# Patient Record
Sex: Male | Born: 1975 | Race: Black or African American | Hispanic: No | Marital: Single | Smoking: Never smoker
Health system: Southern US, Community
[De-identification: ages and names within clinical notes are randomized; demographics above are authoritative.]

## PROBLEM LIST (undated history)

## (undated) DIAGNOSIS — U071 COVID-19: Secondary | ICD-10-CM

## (undated) DIAGNOSIS — J189 Pneumonia, unspecified organism: Secondary | ICD-10-CM

---

## 2002-06-28 ENCOUNTER — Encounter: Payer: Self-pay | Admitting: Emergency Medicine

## 2002-06-28 ENCOUNTER — Emergency Department (HOSPITAL_COMMUNITY): Admission: EM | Admit: 2002-06-28 | Discharge: 2002-06-29 | Payer: Self-pay | Admitting: Emergency Medicine

## 2002-07-20 ENCOUNTER — Emergency Department (HOSPITAL_COMMUNITY): Admission: EM | Admit: 2002-07-20 | Discharge: 2002-07-20 | Payer: Self-pay | Admitting: *Deleted

## 2002-07-23 ENCOUNTER — Emergency Department (HOSPITAL_COMMUNITY): Admission: EM | Admit: 2002-07-23 | Discharge: 2002-07-23 | Payer: Self-pay | Admitting: Emergency Medicine

## 2008-07-20 ENCOUNTER — Emergency Department (HOSPITAL_COMMUNITY): Admission: EM | Admit: 2008-07-20 | Discharge: 2008-07-20 | Payer: Self-pay | Admitting: Family Medicine

## 2015-03-12 ENCOUNTER — Encounter (HOSPITAL_COMMUNITY): Payer: Self-pay | Admitting: Nurse Practitioner

## 2015-03-12 ENCOUNTER — Emergency Department (HOSPITAL_COMMUNITY)
Admission: EM | Admit: 2015-03-12 | Discharge: 2015-03-12 | Disposition: A | Discharge status: Home / Self Care | Payer: Self-pay | Attending: Emergency Medicine | Admitting: Emergency Medicine

## 2015-03-12 ENCOUNTER — Emergency Department (HOSPITAL_COMMUNITY): Payer: Self-pay

## 2015-03-12 DIAGNOSIS — Z8701 Personal history of pneumonia (recurrent): Secondary | ICD-10-CM | POA: Insufficient documentation

## 2015-03-12 DIAGNOSIS — J069 Acute upper respiratory infection, unspecified: Secondary | ICD-10-CM | POA: Insufficient documentation

## 2015-03-12 HISTORY — DX: Pneumonia, unspecified organism: J18.9

## 2015-03-12 MED ORDER — GUAIFENESIN-CODEINE 100-10 MG/5ML PO SOLN: 5.0000 mL | Freq: Three times a day (TID) | ORAL | Status: DC | PRN

## 2015-03-12 MED ORDER — FLUTICASONE PROPIONATE 50 MCG/ACT NA SUSP: 2.0000 | Freq: Every day | NASAL | Status: DC

## 2015-03-12 NOTE — ED Notes (Signed)
Declined W/C at D/C and was escorted to lobby by RN. 

## 2015-03-12 NOTE — ED Notes (Signed)
He c/o dry cough and fevers for past 3-4 days. He denies pain, sob, n/v, bowel/bladder changes. He was treated for pneumonia with azithromycin orally in the beginning of February with full symptom resolution. He is A&Ox4, resp e/u

## 2015-03-12 NOTE — ED Provider Notes (Signed)
CSN: [id number]     Arrival date & time 03/12/15  1436 History  By signing my name below, I, Evon Slack, attest that this documentation has been prepared under the direction and in the presence of [employer], PA-C. Electronically Signed: Evon Slack, ED Scribe. 03/12/2015. 5:06 PM.     Chief Complaint  Patient presents with  . Cough   The history is provided by the patient. No language interpreter was used.   HPI Comments: Harold Farley is a 40 y.o. male who presents to the Emergency Department complaining of dry cough onset 4 days prior. He reports associated subjective fever, rhinorrhea and congestion. Pt doesn't report any medications PTA. Denies CP, SOB, orthopnea or leg swelling. Pt denies tobacco use. Reports Hx of Pneumonia on February 6 and was placed on azithromycin with relief of symptoms.   Past Medical History  Diagnosis Date  . Pneumonia    History reviewed. No pertinent past surgical history. History reviewed. No pertinent family history. Social History  Substance Use Topics  . Smoking status: Never Smoker   . Smokeless tobacco: None  . Alcohol Use: Yes    Review of Systems  All other systems reviewed and are negative.    Allergies  Review of patient's allergies indicates no known allergies.  Home Medications   Prior to Admission medications   Medication Sig Start Date End Date Taking? Authorizing Provider  fluticasone (FLONASE) 50 MCG/ACT nasal spray Place 2 sprays into both nostrils daily. 03/12/15   Tinnie Gens Lolah Coghlan, PA-C  guaiFENesin-codeine 100-10 MG/5ML syrup Take 5 mLs by mouth 3 (three) times daily as needed for cough. 03/12/15   Bellamia Ferch, PA-C   BP 118/73 mmHg  Pulse 95  Temp(Src) 99.4 F (37.4 C) (Oral)  Resp 18  SpO2 100%   Physical Exam  Constitutional: He is oriented to person, place, and time. He appears well-developed and well-nourished. No distress.  HENT:  Head: Normocephalic and atraumatic.  Right Ear: Tympanic  membrane and external ear normal.  Left Ear: Tympanic membrane and external ear normal.  Nose: Rhinorrhea present.  Mouth/Throat: Uvula is midline and oropharynx is clear and moist. No oropharyngeal exudate, posterior oropharyngeal edema, posterior oropharyngeal erythema or tonsillar abscesses.  Eyes: Conjunctivae and EOM are normal.  Neck: Neck supple. No tracheal deviation present.  Cardiovascular: Normal rate, regular rhythm and normal heart sounds.   Pulmonary/Chest: Effort normal and breath sounds normal. No respiratory distress. He has no wheezes. He has no rales. He exhibits no tenderness.  Abdominal: Soft. There is no tenderness.  Musculoskeletal: Normal range of motion. He exhibits no edema.  Neurological: He is alert and oriented to person, place, and time.  Skin: Skin is warm and dry.  Psychiatric: He has a normal mood and affect. His behavior is normal.  Nursing note and vitals reviewed.   ED Course  Procedures (including critical care time) DIAGNOSTIC STUDIES: Oxygen Saturation is 96% on RA, adequate by my interpretation.    COORDINATION OF CARE: 5:06 PM-Discussed treatment plan with pt at bedside and pt agreed to plan.     Labs Review Labs Reviewed - No data to display  Imaging Review Dg Chest 2 View  03/12/2015  CLINICAL DATA:  Pt reports dry cough x 3-4 days; pt reports PNA dx 02/27/15; non-smoker EXAM: CHEST  2 VIEW COMPARISON:  None. FINDINGS: Heart size is normal. There is no focal consolidation. Small bilateral pleural effusions are present. There is no pulmonary edema. Visualized osseous structures have a normal appearance.  IMPRESSION: Small bilateral pleural effusions. Electronically Signed   By: Norva Pavlov M.D.   On: 03/12/2015 16:40      EKG Interpretation None      MDM   Final diagnoses:  URI (upper respiratory infection)   Labs:  Imaging: DG chest  Consults:  Therapeutics:  Discharge Meds:   Assessment/Plan: 40 year old male  presents today with URI symptoms. Patient was recently treated for suspected pneumonia. He had no chest x-ray at that time was reporting shortness of breath, cough, fever. He was placed on azithromycin which improved his symptoms. Today's presentation with upper respiratory infectious symptoms, oxygen on a percent, afebrile, nontoxic with reassuring vital signs. Patient is in no acute distress, chest x-ray showed small bilateral pleural effusions. Due to patient's recent suspected pneumonia and recent treatment, patient will be instructed to follow-up in 2 days for reevaluation to monitor progression of upper respiratory infection and reevaluation of pleural effusions. Patient is given strict return precautions he verbalized understanding and agreement for today's plan had no further questions or concerns at time of discharge    I personally performed the services described in this documentation, which was scribed in my presence. The recorded information has been reviewed and is accurate.      Eyvonne Mechanic, PA-C 03/13/15 1610  Azalia Bilis, MD 03/13/15 [phone]

## 2015-03-12 NOTE — Discharge Instructions (Signed)
Please follow-up in 2 days with your primary care for repeat chest x-ray and reevaluation. If any new or worsening signs or symptoms present please return immediately to the emergency room. Upper Respiratory Infection, Adult Most upper respiratory infections (URIs) are a viral infection of the air passages leading to the lungs. A URI affects the nose, throat, and upper air passages. The most common type of URI is nasopharyngitis and is typically referred to as "the common cold." URIs run their course and usually go away on their own. Most of the time, a URI does not require medical attention, but sometimes a bacterial infection in the upper airways can follow a viral infection. This is called a secondary infection. Sinus and middle ear infections are common types of secondary upper respiratory infections. Bacterial pneumonia can also complicate a URI. A URI can worsen asthma and chronic obstructive pulmonary disease (COPD). Sometimes, these complications can require emergency medical care and may be life threatening.  CAUSES Almost all URIs are caused by viruses. A virus is a type of germ and can spread from one person to another.  RISKS FACTORS You may be at risk for a URI if:   You smoke.   You have chronic heart or lung disease.  You have a weakened defense (immune) system.   You are very young or very old.   You have nasal allergies or asthma.  You work in crowded or poorly ventilated areas.  You work in health care facilities or schools. SIGNS AND SYMPTOMS  Symptoms typically develop 2-3 days after you come in contact with a cold virus. Most viral URIs last 7-10 days. However, viral URIs from the influenza virus (flu virus) can last 14-18 days and are typically more severe. Symptoms may include:   Runny or stuffy (congested) nose.   Sneezing.   Cough.   Sore throat.   Headache.   Fatigue.   Fever.   Loss of appetite.   Pain in your forehead, behind your eyes,  and over your cheekbones (sinus pain).  Muscle aches.  DIAGNOSIS  Your health care provider may diagnose a URI by:  Physical exam.  Tests to check that your symptoms are not due to another condition such as:  Strep throat.  Sinusitis.  Pneumonia.  Asthma. TREATMENT  A URI goes away on its own with time. It cannot be cured with medicines, but medicines may be prescribed or recommended to relieve symptoms. Medicines may help:  Reduce your fever.  Reduce your cough.  Relieve nasal congestion. HOME CARE INSTRUCTIONS   Take medicines only as directed by your health care provider.   Gargle warm saltwater or take cough drops to comfort your throat as directed by your health care provider.  Use a warm mist humidifier or inhale steam from a shower to increase air moisture. This may make it easier to breathe.  Drink enough fluid to keep your urine clear or pale yellow.   Eat soups and other clear broths and maintain good nutrition.   Rest as needed.   Return to work when your temperature has returned to normal or as your health care provider advises. You may need to stay home longer to avoid infecting others. You can also use a face mask and careful hand washing to prevent spread of the virus.  Increase the usage of your inhaler if you have asthma.   Do not use any tobacco products, including cigarettes, chewing tobacco, or electronic cigarettes. If you need help quitting, ask your  health care provider. PREVENTION  The best way to protect yourself from getting a cold is to practice good hygiene.   Avoid oral or hand contact with people with cold symptoms.   Wash your hands often if contact occurs.  There is no clear evidence that vitamin C, vitamin E, echinacea, or exercise reduces the chance of developing a cold. However, it is always recommended to get plenty of rest, exercise, and practice good nutrition.  SEEK MEDICAL CARE IF:   You are getting worse rather than  better.   Your symptoms are not controlled by medicine.   You have chills.  You have worsening shortness of breath.  You have brown or red mucus.  You have yellow or brown nasal discharge.  You have pain in your face, especially when you bend forward.  You have a fever.  You have swollen neck glands.  You have pain while swallowing.  You have white areas in the back of your throat. SEEK IMMEDIATE MEDICAL CARE IF:   You have severe or persistent:  Headache.  Ear pain.  Sinus pain.  Chest pain.  You have chronic lung disease and any of the following:  Wheezing.  Prolonged cough.  Coughing up blood.  A change in your usual mucus.  You have a stiff neck.  You have changes in your:  Vision.  Hearing.  Thinking.  Mood. MAKE SURE YOU:   Understand these instructions.  Will watch your condition.  Will get help right away if you are not doing well or get worse.   This information is not intended to replace advice given to you by your health care provider. Make sure you discuss any questions you have with your health care provider.   Document Released: 07/03/2000 Document Revised: 05/24/2014 Document Reviewed: 04/14/2013 Elsevier Interactive Patient Education [employer]

## 2017-12-08 ENCOUNTER — Emergency Department (HOSPITAL_COMMUNITY): Payer: No Typology Code available for payment source

## 2017-12-08 ENCOUNTER — Encounter (HOSPITAL_COMMUNITY): Payer: Self-pay | Admitting: Emergency Medicine

## 2017-12-08 ENCOUNTER — Other Ambulatory Visit: Payer: Self-pay

## 2017-12-08 ENCOUNTER — Emergency Department (HOSPITAL_COMMUNITY)
Admission: EM | Admit: 2017-12-08 | Discharge: 2017-12-08 | Disposition: A | Discharge status: Home / Self Care | Payer: No Typology Code available for payment source | Attending: Emergency Medicine | Admitting: Emergency Medicine

## 2017-12-08 DIAGNOSIS — S61512A Laceration without foreign body of left wrist, initial encounter: Secondary | ICD-10-CM | POA: Insufficient documentation

## 2017-12-08 DIAGNOSIS — W268XXA Contact with other sharp object(s), not elsewhere classified, initial encounter: Secondary | ICD-10-CM | POA: Diagnosis not present

## 2017-12-08 DIAGNOSIS — R202 Paresthesia of skin: Secondary | ICD-10-CM | POA: Diagnosis not present

## 2017-12-08 DIAGNOSIS — Y998 Other external cause status: Secondary | ICD-10-CM | POA: Insufficient documentation

## 2017-12-08 DIAGNOSIS — Y9289 Other specified places as the place of occurrence of the external cause: Secondary | ICD-10-CM | POA: Diagnosis not present

## 2017-12-08 DIAGNOSIS — Z23 Encounter for immunization: Secondary | ICD-10-CM | POA: Diagnosis not present

## 2017-12-08 DIAGNOSIS — Y9389 Activity, other specified: Secondary | ICD-10-CM | POA: Insufficient documentation

## 2017-12-08 DIAGNOSIS — S6992XA Unspecified injury of left wrist, hand and finger(s), initial encounter: Secondary | ICD-10-CM | POA: Diagnosis present

## 2017-12-08 MED ORDER — CEPHALEXIN 500 MG PO CAPS
500.0000 mg | ORAL_CAPSULE | Freq: Once | ORAL | Status: AC
  Administered 2017-12-08: 500 mg via ORAL
  Filled 2017-12-08: qty 1

## 2017-12-08 MED ORDER — LIDOCAINE-EPINEPHRINE (PF) 2 %-1:200000 IJ SOLN
10.0000 mL | Freq: Once | INTRAMUSCULAR | Status: AC
  Administered 2017-12-08: 10 mL
  Filled 2017-12-08: qty 20

## 2017-12-08 MED ORDER — TETANUS-DIPHTH-ACELL PERTUSSIS 5-2.5-18.5 LF-MCG/0.5 IM SUSP
0.5000 mL | Freq: Once | INTRAMUSCULAR | Status: AC
  Administered 2017-12-08: 0.5 mL via INTRAMUSCULAR
  Filled 2017-12-08: qty 0.5

## 2017-12-08 MED ORDER — CEPHALEXIN 500 MG PO CAPS: 500.0000 mg | ORAL_CAPSULE | Freq: Four times a day (QID) | ORAL | 0 refills | Status: DC

## 2017-12-08 NOTE — ED Triage Notes (Signed)
Pt was at work and cut his left wrist with razor. Reports hand in tingling.

## 2017-12-08 NOTE — ED Provider Notes (Signed)
[facility]-EMERGENCY DEPT Provider Note   CSN: [id number] Arrival date & time: 12/08/17  [id number]     History   Chief Complaint Chief Complaint  Patient presents with  . Laceration    HPI Harold Farley is a 42 y.o. male.  Patient presents with acute onset of laceration to the volar aspect of his left wrist occurring approximately 30 minutes ago.  Patient was using a razor blade when he cut his left wrist.  This was at work.  Patient has tingling but not full numbness in all of his fingers.  He denies any weakness in his wrist, fingers, or hand.  Patient applied pressure and a bandage prior to arrival.  No other treatments.  Last tetanus unknown.  Reports mild pain.  Course is constant.     Past Medical History:  Diagnosis Date  . Pneumonia     There are no active problems to display for this patient.   History reviewed. No pertinent surgical history.      Home Medications    Prior to Admission medications   Medication Sig Start Date End Date Taking? Authorizing Provider  fluticasone (FLONASE) 50 MCG/ACT nasal spray Place 2 sprays into both nostrils daily. 03/12/15   Hedges, Tinnie GensJeffrey, PA-C  guaiFENesin-codeine 100-10 MG/5ML syrup Take 5 mLs by mouth 3 (three) times daily as needed for cough. 03/12/15   Eyvonne MechanicHedges, Jeffrey, PA-C    Family History No family history on file.  Social History Social History   Tobacco Use  . Smoking status: Never Smoker  . Smokeless tobacco: Never Used  Substance Use Topics  . Alcohol use: Yes  . Drug use: No     Allergies   Patient has no known allergies.   Review of Systems Review of Systems  Constitutional: Negative for activity change.  Musculoskeletal: Negative for arthralgias, back pain, gait problem, joint swelling and neck pain.  Skin: Positive for wound.  Neurological: Positive for numbness (no full numbness, + paresthesias). Negative for weakness.     Physical Exam Updated Vital Signs BP (!)  148/80 (BP Location: Right Arm)   Pulse 76   Temp 97.6 F (36.4 C) (Oral)   Resp 17   Ht 6\' 3"  (1.905 m)   Wt 103 kg   SpO2 99%   BMI 28.37 kg/m   Physical Exam  Constitutional: He appears well-developed and well-nourished.  HENT:  Head: Normocephalic and atraumatic.  Eyes: Conjunctivae are normal.  Neck: Normal range of motion. Neck supple.  Cardiovascular: Normal pulses. Exam reveals no decreased pulses.  Musculoskeletal: He exhibits tenderness. He exhibits no edema.  1 cm laceration, linear, hemostatic, overlying the volar surface of the left wrist at the radial aspect. Wound explored. Wound is puncture in nature.  I can visualize a structure, appears to be a tendon, along the ulnar aspect of the defect.  No defect noted of this structure.  Patient with full strength in flexion and extension of the wrist.  Full strength and range of motion of the hand and fingers.  Neurological: He is alert. No sensory deficit.  Patient reports decreased sensation in all fingers, however no complete numbness and no deficits in any distinct nerve distribution.  Skin: Skin is warm and dry.  Psychiatric: He has a normal mood and affect.  Nursing note and vitals reviewed.    ED Treatments / Results  Labs (all labs ordered are listed, but only abnormal results are displayed) Labs Reviewed - No data to display  EKG  None  Radiology Dg Wrist Complete Left  Result Date: 12/08/2017 CLINICAL DATA:  Left wrist laceration. EXAM: LEFT WRIST - COMPLETE 3+ VIEW COMPARISON:  None. FINDINGS: There is no evidence of fracture or dislocation. There is no evidence of arthropathy or other focal bone abnormality. Soft tissues are unremarkable. IMPRESSION: Negative. Electronically Signed   By: Lupita Raider, M.D.   On: 12/08/2017 08:24    Procedures .Marland KitchenLaceration Repair Date/Time: 12/08/2017 9:32 AM Performed by: Renne Crigler, PA-C Authorized by: Renne Crigler, PA-C   Consent:    Consent obtained:   Verbal   Consent given by:  Patient   Risks discussed:  Infection, pain, tendon damage, retained foreign body, vascular damage and nerve damage   Alternatives discussed:  No treatment Anesthesia (see MAR for exact dosages):    Anesthesia method:  Local infiltration   Local anesthetic:  Lidocaine 2% WITH epi Laceration details:    Location:  Hand   Hand location:  L wrist   Length (cm):  1 Repair type:    Repair type:  Simple Pre-procedure details:    Preparation:  Patient was prepped and draped in usual sterile fashion and imaging obtained to evaluate for foreign bodies Exploration:    Hemostasis achieved with:  Epinephrine   Wound exploration: wound explored through full range of motion and entire depth of wound probed and visualized     Wound extent: no nerve damage noted and no tendon damage noted   Treatment:    Area cleansed with:  Saline   Amount of cleaning:  Extensive   Irrigation solution:  Sterile saline   Irrigation volume:  250   Irrigation method:  Pressure wash   Visualized foreign bodies/material removed: no   Skin repair:    Repair method:  Sutures   Suture size:  5-0   Suture material:  Nylon   Suture technique:  Simple interrupted   Number of sutures:  3 Approximation:    Approximation:  Close Post-procedure details:    Dressing:  Open (no dressing)   Patient tolerance of procedure:  Tolerated well, no immediate complications   (including critical care time)  Medications Ordered in ED Medications  lidocaine-EPINEPHrine (XYLOCAINE W/EPI) 2 %-1:200000 (PF) injection 10 mL (has no administration in time range)  cephALEXin (KEFLEX) capsule 500 mg (has no administration in time range)  Tdap (BOOSTRIX) injection 0.5 mL (0.5 mLs Intramuscular Given 12/08/17 0800)     Initial Impression / Assessment and Plan / ED Course  I have reviewed the triage vital signs and the nursing notes.  Pertinent labs & imaging results that were available during my care of the  patient were reviewed by me and considered in my medical decision making (see chart for details).     Patient seen and examined. Work-up initiated. Medications ordered.   Vital signs reviewed and are as follows: BP (!) 148/80 (BP Location: Right Arm)   Pulse 76   Temp 97.6 F (36.4 C) (Oral)   Resp 17   Ht 6\' 3"  (1.905 m)   Wt 103 kg   SpO2 99%   BMI 28.37 kg/m   UDS performed.  X-ray reviewed.  Wound repaired as above.  Patient discharged with Velcro wrist splint.  Given puncture nature of the wound, patient was started on prophylactic Keflex for 5 days.  He is instructed to have the sutures removed in 10 days.  Information for orthopedic hand surgery given in case he has any issues with healing or function as the  wound heals.  9:25 AM Patient counseled on wound care. Patient counseled on need to return or see PCP/urgent care for suture removal in 10 days. Patient was urged to return to the Emergency Department urgently with worsening pain, swelling, expanding erythema especially if it streaks away from the affected area, fever, or if they have any other concerns. Patient verbalized understanding.    Final Clinical Impressions(s) / ED Diagnoses   Final diagnoses:  Laceration of left wrist, initial encounter   Wrist laceration.  Irrigated and repaired as above.  Do not suspect any major nerve or tendon injury.  Patient does not have any deficits.  He has paresthesias in the hand but no numbness in a distinct nerve distribution.  No indication for emergent orthopedic involvement.  ED Discharge Orders    None       Renne Crigler, PA-C 12/08/17 2952    Pricilla Loveless, MD 12/08/17 [phone]

## 2017-12-08 NOTE — Discharge Instructions (Signed)
Please read and follow all provided instructions.  Your diagnoses today include:  1. Laceration of left wrist, initial encounter     Tests performed today include:  X-ray of the affected area that did not show any foreign bodies or broken bones  Vital signs. See below for your results today.   Medications prescribed:   Keflex (cephalexin) - antibiotic  You have been prescribed an antibiotic medicine: take the entire course of medicine even if you are feeling better. Stopping early can cause the antibiotic not to work.  Take any prescribed medications only as directed.   Home care instructions:  Follow any educational materials and wound care instructions contained in this packet.   Use the wrist splint for the next week if you are using the wrist or working to help protect the laceration area.  Follow-up instructions: Suture Removal: Return to the Emergency Department or see your primary care care doctor in 10 days for a recheck of your wound and removal of your sutures or staples.    If you have any concerns regarding the strength in your wrist or with sensation, please follow-up with the orthopedic hand doctor for evaluation.  Return instructions:  Return to the Emergency Department if you have:  Fever  Worsening pain  Worsening swelling of the wound  Pus draining from the wound  Redness of the skin that moves away from the wound, especially if it streaks away from the affected area   Any other emergent concerns  Your vital signs today were: BP (!) 148/80 (BP Location: Right Arm)    Pulse 76    Temp 97.6 F (36.4 C) (Oral)    Resp 17    Ht 6\' 3"  (1.905 m)    Wt 103 kg    SpO2 99%    BMI 28.37 kg/m  If your blood pressure (BP) was elevated above 135/85 this visit, please have this repeated by your doctor within one month. --------------

## 2017-12-20 ENCOUNTER — Encounter (HOSPITAL_COMMUNITY): Payer: Self-pay | Admitting: Emergency Medicine

## 2017-12-20 ENCOUNTER — Other Ambulatory Visit: Payer: Self-pay

## 2017-12-20 ENCOUNTER — Ambulatory Visit (HOSPITAL_COMMUNITY)
Admission: EM | Admit: 2017-12-20 | Discharge: 2017-12-20 | Disposition: A | Discharge status: Home / Self Care | Payer: Worker's Compensation | Attending: Internal Medicine | Admitting: Internal Medicine

## 2017-12-20 DIAGNOSIS — Z4802 Encounter for removal of sutures: Secondary | ICD-10-CM | POA: Diagnosis not present

## 2017-12-20 NOTE — ED Triage Notes (Signed)
Suture removal from left wrist

## 2019-03-26 ENCOUNTER — Encounter (HOSPITAL_COMMUNITY): Payer: Self-pay

## 2019-03-26 ENCOUNTER — Other Ambulatory Visit: Payer: Self-pay

## 2019-03-26 ENCOUNTER — Ambulatory Visit (HOSPITAL_COMMUNITY)
Admission: EM | Admit: 2019-03-26 | Discharge: 2019-03-26 | Disposition: A | Discharge status: Home / Self Care | Payer: Commercial Managed Care - PPO | Attending: Family Medicine | Admitting: Family Medicine

## 2019-03-26 DIAGNOSIS — M79605 Pain in left leg: Secondary | ICD-10-CM | POA: Diagnosis not present

## 2019-03-26 MED ORDER — CYCLOBENZAPRINE HCL 10 MG PO TABS: 10.0000 mg | ORAL_TABLET | Freq: Two times a day (BID) | ORAL | 0 refills | Status: DC | PRN

## 2019-03-26 MED ORDER — IBUPROFEN 800 MG PO TABS: 800.0000 mg | ORAL_TABLET | Freq: Three times a day (TID) | ORAL | 0 refills | Status: DC | PRN

## 2019-03-26 NOTE — Discharge Instructions (Addendum)
You have likely bruised your Left thigh. I have sent in ibuprofen 800mg  every 8 hours as needed for pain.  I have also sent in Flexeril 10mg  twice a day as needed for muscle spasms.  This should self resolve over the next 1-2 weeks.   Follow up with primary care or this office as needed.
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
## 2019-05-10 ENCOUNTER — Ambulatory Visit: Payer: Commercial Managed Care - PPO | Attending: Internal Medicine

## 2019-05-10 DIAGNOSIS — Z20822 Contact with and (suspected) exposure to covid-19: Secondary | ICD-10-CM

## 2019-05-11 LAB — SARS-COV-2, NAA 2 DAY TAT

## 2019-05-11 LAB — NOVEL CORONAVIRUS, NAA: SARS-CoV-2, NAA: NOT DETECTED

## 2019-06-29 ENCOUNTER — Ambulatory Visit (HOSPITAL_COMMUNITY)
Admission: EM | Admit: 2019-06-29 | Discharge: 2019-06-29 | Disposition: A | Discharge status: Home / Self Care | Payer: Commercial Managed Care - PPO | Attending: Family Medicine | Admitting: Family Medicine

## 2019-06-29 ENCOUNTER — Encounter (HOSPITAL_COMMUNITY): Payer: Self-pay

## 2019-06-29 ENCOUNTER — Other Ambulatory Visit: Payer: Self-pay

## 2019-06-29 DIAGNOSIS — M25462 Effusion, left knee: Secondary | ICD-10-CM | POA: Diagnosis not present

## 2019-06-29 DIAGNOSIS — M25562 Pain in left knee: Secondary | ICD-10-CM

## 2019-06-29 DIAGNOSIS — T24202A Burn of second degree of unspecified site of left lower limb, except ankle and foot, initial encounter: Secondary | ICD-10-CM

## 2019-06-29 MED ORDER — IBUPROFEN 800 MG PO TABS: 800.0000 mg | ORAL_TABLET | Freq: Three times a day (TID) | ORAL | 0 refills | Status: AC | PRN

## 2019-06-29 MED ORDER — SILVER SULFADIAZINE 1 % EX CREA: 1.0000 "application " | TOPICAL_CREAM | Freq: Every day | CUTANEOUS | 0 refills | Status: AC

## 2019-06-29 NOTE — Discharge Instructions (Addendum)
  Limit walking on injured back Keep Ace wrap on Wash daily and apply Silvadene cream and gauze wrap See sports medicine later this week

## 2019-06-29 NOTE — ED Triage Notes (Signed)
Pt reports intermittent left knee pain "out of the blue" x 2 weeks. Pain is worse when walking.Pt woke up with a black ring in the lft knee after he felt sleep with an ice pack in the left knee last night. Pt have no try any medication for the pain.
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
## 2019-12-20 ENCOUNTER — Ambulatory Visit (HOSPITAL_COMMUNITY)
Admission: EM | Admit: 2019-12-20 | Discharge: 2019-12-20 | Disposition: A | Discharge status: Home / Self Care | Payer: Commercial Managed Care - PPO | Attending: Physician Assistant | Admitting: Physician Assistant

## 2019-12-20 ENCOUNTER — Encounter (HOSPITAL_COMMUNITY): Payer: Self-pay

## 2019-12-20 ENCOUNTER — Other Ambulatory Visit: Payer: Self-pay

## 2019-12-20 DIAGNOSIS — R509 Fever, unspecified: Secondary | ICD-10-CM | POA: Diagnosis not present

## 2019-12-20 DIAGNOSIS — U071 COVID-19: Secondary | ICD-10-CM | POA: Diagnosis not present

## 2019-12-20 NOTE — ED Triage Notes (Signed)
Pt in with c/o lower back pain and fever Tmax of 101.4 that has been going on for about 3 days now.  Pt has been taking ibuprofen and dayquil for sxs  denies N/v, diarrhea, cough, runny nose

## 2019-12-20 NOTE — Discharge Instructions (Addendum)
Take tyenol as needed for fever.  COVID test pending, self isolate until results are back.  If positive quarantine for 10 days from symptom onset.  If you develop urinary symptoms or worsening back pain please return.

## 2019-12-20 NOTE — ED Provider Notes (Signed)
[facility]    CSN: [id number] Arrival date & time: 12/20/19  1955      History   Chief Complaint Chief Complaint  Patient presents with  . Back Pain  . Fever    HPI Harold Farley is a 44 y.o. male.   Pt reports several days ago he was walking down steps and his "back went out".  He reports lower back muscle spasms.  Denies injury or trauma.  Denies radiation of pain, numbness, tingling, or weakness. He reports the back pain has now resolved.  He is now complaining of fever, at highest 101.4, that started 3 days ago.  He denies dysuria, flank pain, cough, congestion, sore throat, shortness of breath.  He has taken nothing for the sx.  Denies sick contacts.      Past Medical History:  Diagnosis Date  . Pneumonia     There are no problems to display for this patient.   History reviewed. No pertinent surgical history.     Home Medications    Prior to Admission medications   Medication Sig Start Date End Date Taking? Authorizing Provider  ibuprofen (ADVIL) 800 MG tablet Take 1 tablet (800 mg total) by mouth every 8 (eight) hours as needed for moderate pain. 06/29/19   Eustace Moore, MD  silver sulfADIAZINE (SILVADENE) 1 % cream Apply 1 application topically daily. 06/29/19   Eustace Moore, MD  fluticasone [facility]) 50 MCG/ACT nasal spray Place 2 sprays into both nostrils daily. 03/12/15 06/29/19  Eyvonne Mechanic, PA-C    Family History Family History  Family history unknown: Yes    Social History Social History   Tobacco Use  . Smoking status: Never Smoker  . Smokeless tobacco: Never Used  Vaping Use  . Vaping Use: Never used  Substance Use Topics  . Alcohol use: Yes  . Drug use: No     Allergies   Patient has no known allergies.   Review of Systems Review of Systems  Constitutional: Positive for fever. Negative for chills.  HENT: Negative for ear pain and sore throat.   Eyes: Negative for pain and visual disturbance.    Respiratory: Negative for cough and shortness of breath.   Cardiovascular: Negative for chest pain and palpitations.  Gastrointestinal: Negative for abdominal pain and vomiting.  Genitourinary: Negative for dysuria and hematuria.  Musculoskeletal: Negative for arthralgias and back pain.  Skin: Negative for color change and rash.  Neurological: Negative for seizures and syncope.  All other systems reviewed and are negative.    Physical Exam Triage Vital Signs ED Triage Vitals  Enc Vitals Group     BP 12/20/19 2005 139/90     Pulse Rate 12/20/19 2005 94     Resp 12/20/19 2005 20     Temp 12/20/19 2005 (!) 100.5 F (38.1 C)     Temp Source 12/20/19 2005 Oral     SpO2 12/20/19 2005 96 %     Weight --      Height --      Head Circumference --      Peak Flow --      Pain Score 12/20/19 2004 1     Pain Loc --      Pain Edu? --      Excl. in [place]? --    No data found.  Updated Vital Signs BP 139/90 (BP Location: Right Arm)   Pulse 94   Temp (!) 100.5 F (38.1 C) (Oral)   Resp 20  SpO2 96%   Visual Acuity Right Eye Distance:   Left Eye Distance:   Bilateral Distance:    Right Eye Near:   Left Eye Near:    Bilateral Near:     Physical Exam Vitals and nursing note reviewed.  Constitutional:      Appearance: He is well-developed.  HENT:     Head: Normocephalic and atraumatic.  Eyes:     Conjunctiva/sclera: Conjunctivae normal.  Cardiovascular:     Rate and Rhythm: Normal rate and regular rhythm.     Heart sounds: No murmur heard.   Pulmonary:     Effort: Pulmonary effort is normal. No respiratory distress.     Breath sounds: Normal breath sounds. No decreased breath sounds, wheezing, rhonchi or rales.  Abdominal:     Palpations: Abdomen is soft.     Tenderness: There is no abdominal tenderness.  Musculoskeletal:     Cervical back: Normal and neck supple.     Thoracic back: Normal.     Lumbar back: Normal.  Skin:    General: Skin is warm and dry.   Neurological:     Mental Status: He is alert.      UC Treatments / Results  Labs (all labs ordered are listed, but only abnormal results are displayed) Labs Reviewed - No data to display  EKG   Radiology No results found.  Procedures Procedures (including critical care time)  Medications Ordered in UC Medications - No data to display  Initial Impression / Assessment and Plan / UC Course  I have reviewed the triage vital signs and the nursing notes.  Pertinent labs & imaging results that were available during my care of the patient were reviewed by me and considered in my medical decision making (see chart for details).     Lower back spasm which has now improved.  No CVA tenderness on exam.  Denies urinary sx.  Normal physical exam.  Supportive treatment of fever.  COVID test pending.  Return precautions discussed.  Final Clinical Impressions(s) / UC Diagnoses   Final diagnoses:  None   Discharge Instructions   None    ED Prescriptions    None     PDMP not reviewed this encounter.   Jodell Cipro, PA-C 12/20/19 2023

## 2019-12-21 LAB — SARS CORONAVIRUS 2 (TAT 6-24 HRS): SARS Coronavirus 2: POSITIVE — AB

## 2019-12-22 ENCOUNTER — Telehealth: Payer: Self-pay | Admitting: Nurse Practitioner

## 2019-12-22 ENCOUNTER — Telehealth: Payer: Self-pay | Admitting: Family

## 2019-12-22 ENCOUNTER — Encounter: Payer: Self-pay | Admitting: Nurse Practitioner

## 2019-12-22 DIAGNOSIS — U071 COVID-19: Secondary | ICD-10-CM

## 2019-12-22 NOTE — Telephone Encounter (Signed)
Called to discuss with Harold Farley about Covid symptoms and the use of a monoclonal antibody infusion for those with mild to moderate Covid symptoms and at a high risk of hospitalization.     Pt is qualified for this infusion at the [facility] center due to co-morbid conditions and/or a member of an at-risk group, however declines infusion at this time. He'd like to research medication more on his own before committing.  Symptoms tier reviewed as well as criteria for ending isolation.  Symptoms reviewed that would warrant ED/Hospital evaluation. Preventative practices reviewed. Patient verbalized understanding. Patient advised to call back if he/she opts to proceed with infusion. Callback number provided. Urgent care and/or ER precautions given for severe symptoms. Last date eligible for infusion: 12/26/19   There are no problems to display for this patient.    Consuello Masse, NP [phone] [email or web address]

## 2019-12-22 NOTE — Telephone Encounter (Signed)
Called to discuss with Jackie Plum about Covid symptoms and potential candidacy for the use of sotrovimab, a combination monoclonal antibody infusion for those with mild to moderate Covid symptoms and at a high risk of hospitalization.     Pt is qualified for this infusion at the infusion center due to co-morbid conditions and/or a member of an at-risk group, however unable to reach patient. VM left.   Markee Matera,NP

## 2019-12-26 ENCOUNTER — Emergency Department (HOSPITAL_COMMUNITY)
Admission: EM | Admit: 2019-12-26 | Discharge: 2019-12-26 | Disposition: A | Discharge status: Home / Self Care | Payer: Commercial Managed Care - PPO | Attending: Emergency Medicine | Admitting: Emergency Medicine

## 2019-12-26 ENCOUNTER — Other Ambulatory Visit: Payer: Self-pay

## 2019-12-26 ENCOUNTER — Emergency Department (HOSPITAL_COMMUNITY): Payer: Commercial Managed Care - PPO

## 2019-12-26 ENCOUNTER — Encounter (HOSPITAL_COMMUNITY): Payer: Self-pay | Admitting: Emergency Medicine

## 2019-12-26 DIAGNOSIS — U071 COVID-19: Secondary | ICD-10-CM | POA: Diagnosis not present

## 2019-12-26 DIAGNOSIS — R059 Cough, unspecified: Secondary | ICD-10-CM | POA: Diagnosis present

## 2019-12-26 HISTORY — DX: COVID-19: U07.1

## 2019-12-26 MED ORDER — ACETAMINOPHEN 325 MG PO TABS
650.0000 mg | ORAL_TABLET | Freq: Once | ORAL | Status: AC | PRN
  Administered 2019-12-26: 650 mg via ORAL
  Filled 2019-12-26: qty 2

## 2019-12-26 MED ORDER — DOXYCYCLINE HYCLATE 100 MG PO CAPS: 100.0000 mg | ORAL_CAPSULE | Freq: Two times a day (BID) | ORAL | 0 refills | Status: AC

## 2019-12-26 NOTE — ED Triage Notes (Addendum)
Diagnosed with COVID on Monday.  C/o cough.  Denies SOB.  Last took Tylenol at 7:30am.

## 2019-12-26 NOTE — ED Provider Notes (Addendum)
[facility] EMERGENCY DEPARTMENT Provider Note   CSN: [id number] Arrival date & time: 12/26/19  1329     History Chief Complaint  Patient presents with  . Covid Positive  . Cough    Harold Farley is a 44 y.o. male.  HPI   Patient with no significant medical history presents to the emergency department with chief complaint of cough.  Patient states he was diagnosed with Covid on Tuesday but symptoms started last Saturday. He endorses a consistent dry cough that is worse at nighttime and has pain when he has a coughing fit.  He also endorses subjective fevers and chills, general body aches, but denies nasal congestion, ear pain, sore throat, chest pain, shortness of breath, abdominal pain, nausea, vomiting, diarrhea.  He is currently not vaccine against COVID-19, has been taking over-the-counter pain medication like ibuprofen and Tylenol without relief.  He came here today because a concern of his cough which hurts.  He denies any alleviating factors.   Past Medical History:  Diagnosis Date  . COVID   . Pneumonia     There are no problems to display for this patient.   History reviewed. No pertinent surgical history.     Family History  Family history unknown: Yes    Social History   Tobacco Use  . Smoking status: Never Smoker  . Smokeless tobacco: Never Used  Vaping Use  . Vaping Use: Never used  Substance Use Topics  . Alcohol use: Yes  . Drug use: No    Home Medications Prior to Admission medications   Medication Sig Start Date End Date Taking? Authorizing Provider  doxycycline (VIBRAMYCIN) 100 MG capsule Take 1 capsule (100 mg total) by mouth 2 (two) times daily for 7 days. 12/26/19 01/02/20  Carroll Sage, PA-C  ibuprofen (ADVIL) 800 MG tablet Take 1 tablet (800 mg total) by mouth every 8 (eight) hours as needed for moderate pain. 06/29/19   Eustace Moore, MD  silver sulfADIAZINE (SILVADENE) 1 % cream Apply 1 application  topically daily. 06/29/19   Eustace Moore, MD  fluticasone [facility]) 50 MCG/ACT nasal spray Place 2 sprays into both nostrils daily. 03/12/15 06/29/19  Eyvonne Mechanic, PA-C    Allergies    Patient has no known allergies.  Review of Systems   Review of Systems  Constitutional: Positive for chills and fever.  HENT: Negative for congestion and sore throat.   Eyes: Negative for visual disturbance.  Respiratory: Positive for cough. Negative for shortness of breath.   Cardiovascular: Negative for chest pain.  Gastrointestinal: Negative for abdominal pain, diarrhea, nausea and vomiting.  Genitourinary: Negative for enuresis and flank pain.  Musculoskeletal: Positive for myalgias. Negative for back pain.  Skin: Negative for rash.  Neurological: Positive for headaches. Negative for dizziness.  Hematological: Does not bruise/bleed easily.    Physical Exam Updated Vital Signs BP 129/86   Pulse 82   Temp (!) 101.4 F (38.6 C) (Oral)   Resp 20   Ht 6\' 4"  (1.93 m)   Wt 104.3 kg   SpO2 98%   BMI 28.00 kg/m   Physical Exam Vitals and nursing note reviewed.  Constitutional:      General: He is not in acute distress.    Appearance: He is not ill-appearing.  HENT:     Head: Normocephalic and atraumatic.     Right Ear: Tympanic membrane, ear canal and external ear normal.     Left Ear: Tympanic membrane, ear canal and  external ear normal.     Nose: Congestion present.     Comments: Patient had bilateral erythematous turbinates    Mouth/Throat:     Mouth: Mucous membranes are moist.     Pharynx: Oropharynx is clear. No oropharyngeal exudate or posterior oropharyngeal erythema.  Eyes:     Conjunctiva/sclera: Conjunctivae normal.  Cardiovascular:     Rate and Rhythm: Normal rate and regular rhythm.     Pulses: Normal pulses.     Heart sounds: No murmur heard.  No friction rub. No gallop.   Pulmonary:     Effort: No respiratory distress.     Breath sounds: No wheezing, rhonchi or  rales.  Abdominal:     Palpations: Abdomen is soft.     Tenderness: There is no abdominal tenderness.  Musculoskeletal:     Right lower leg: No edema.     Left lower leg: No edema.     Comments: Moving all 4 extremities at difficulty.  Skin:    General: Skin is warm and dry.  Neurological:     Mental Status: He is alert.     Comments: Patient had no difficulty word finding.  Psychiatric:        Mood and Affect: Mood normal.     ED Results / Procedures / Treatments   Labs (all labs ordered are listed, but only abnormal results are displayed) Labs Reviewed - No data to display  EKG None  Radiology DG Chest [facility] 1 View  Result Date: 12/26/2019 CLINICAL DATA:  Cough, COVID-19 positive EXAM: PORTABLE CHEST 1 VIEW COMPARISON:  Radiograph 03/12/2015 FINDINGS: Patchy opacities are present in the left mid lung and right suprahilar region. Airways thickening. No pneumothorax or effusion. The cardiomediastinal contours are unremarkable for portable technique. No acute osseous or soft tissue abnormality. Telemetry leads overlie the chest. IMPRESSION: Patchy opacities in the left mid lung and right suprahilar region, concerning for early pneumonia in the setting of COVID-19. Electronically Signed   By: Kreg Shropshire M.D.   On: 12/26/2019 15:12    Procedures Procedures (including critical care time)  Medications Ordered in ED Medications  acetaminophen (TYLENOL) tablet 650 mg (650 mg Oral Given 12/26/19 1421)    ED Course  I have reviewed the triage vital signs and the nursing notes.  Pertinent labs & imaging results that were available during my care of the patient were reviewed by me and considered in my medical decision making (see chart for details).    MDM Rules/Calculators/A&P                          Patient presents with URI-like symptoms and a cough.  He is alert, does not appear acute distress, vital signs reassuring.  Will obtain chest x-ray for further  evaluation.  X-ray shows patchy opacities in the left midlung and right super hilar region concerning for early pneumonia in the setting of Covid  Low suspicion for systemic infection as patient is nontoxic-appearing,  no obvious source infection noted on exam. I have low suspicion for PE as patient denies pleuritic chest pain, shortness of breath, patient is PERC. low suspicion for strep throat as oropharynx was visualized, no erythema or exudates noted.  Low suspicion patient would need  hospitalized due to  Covid as vital signs reassuring, patient is not in respiratory distress.  Suspect patient's cough and fever secondary to being Covid positive.  Unfortunately patient is outside the window for infusion, will recommend he  follows up with "post Covid care continue" with over-the-counter pain medications.  will also start him on antibiotics for possible early pneumonia seen on x-ray.  Vital signs have remained stable, no indication for hospital admission.  Patient given at home care as well strict return precautions.  Patient verbalized that they understood agreed to said plan.   Final Clinical Impression(s) / ED Diagnoses Final diagnoses:  COVID    Rx / DC Orders ED Discharge Orders         Ordered    doxycycline (VIBRAMYCIN) 100 MG capsule  2 times daily        12/26/19 1522           Carroll Sage, PA-C 12/26/19 1525    Carroll Sage, PA-C 12/26/19 1529    Eber Hong, MD 12/27/19 [phone]

## 2019-12-26 NOTE — Discharge Instructions (Addendum)
You have been seen here for URI like symptoms.  I recommend taking Tylenol for fever control and ibuprofen for pain control please follow dosing on the back of bottle.  I recommend staying hydrated and if you do not an appetite, I recommend soups as this will provide you with fluids and calories.   you are Covid positive you must self quarantine for 10 days starting on symptom onset.  I would like you to contact "post Covid care" as they will provide you with information how to manage your Covid symptoms.    Come back to the emergency department if you develop chest pain, shortness of breath, severe abdominal pain, uncontrolled nausea, vomiting, diarrhea.

## 2019-12-26 NOTE — ED Notes (Signed)
Patient verbalizes understanding of discharge instructions. Opportunity for questioning and answers were provided. Arm band removed by staff, patient discharged from ED. 

## 2019-12-28 ENCOUNTER — Telehealth: Payer: Self-pay

## 2019-12-28 ENCOUNTER — Telehealth: Payer: Self-pay | Admitting: General Practice

## 2019-12-28 NOTE — Telephone Encounter (Signed)
ERROR

## 2019-12-28 NOTE — Telephone Encounter (Signed)
Called pt listed phone number, LVM requesting return call to check on pt symptoms & schedule follow-up at [facility].

## 2020-01-04 ENCOUNTER — Ambulatory Visit (INDEPENDENT_AMBULATORY_CARE_PROVIDER_SITE_OTHER): Payer: Commercial Managed Care - PPO | Admitting: Nurse Practitioner

## 2020-01-04 ENCOUNTER — Other Ambulatory Visit: Payer: Self-pay

## 2020-01-04 VITALS — BP 130/88 | HR 70 | Temp 96.8°F | Ht 76.0 in | Wt 233.0 lb

## 2020-01-04 DIAGNOSIS — U071 COVID-19: Secondary | ICD-10-CM | POA: Diagnosis not present

## 2020-01-04 DIAGNOSIS — J1282 Pneumonia due to coronavirus disease 2019: Secondary | ICD-10-CM

## 2020-01-04 NOTE — Patient Instructions (Signed)
Covid 19 pneumonia Cough:   Stay well hydrated  Stay active  Deep breathing exercises  May start vitamin C daily, vitamin D3 daily, Zinc daily  May take tylenol for fever or pain  May take mucinex twice daily  Will order chest x ray   Follow up:  Follow up in 3 weeks or sooner if needed

## 2020-01-04 NOTE — Progress Notes (Signed)
@Patient  ID: , male    DOB: 12-27-1975, 44 y.o.   MRN: [id number]  Chief Complaint  Patient presents with  . New Patient (Initial Visit)    COVID 11/29. Was seen at ED on 12/5 given doxycycline. Having slight cough    Referring provider: No ref. provider found   44 year old male with no significant health history.  HPI  Patient presents today for post COVID care clinic visit/ED follow-up.  Patient was seen in the ED on 12/26/2019.  His chest x-ray showed Covid pneumonia.  He was prescribed doxycycline.  Patient states that since ED discharge he has been doing well.  He does still have a slight cough that is nonproductive.  He denies any recent significant shortness of breath.Denies f/c/s, n/v/d, hemoptysis, PND, chest pain or edema.       No Known Allergies  Immunization History  Administered Date(s) Administered  . Tdap 12/08/2017    Past Medical History:  Diagnosis Date  . COVID   . Pneumonia     Tobacco History: Social History   Tobacco Use  Smoking Status Never Smoker  Smokeless Tobacco Never Used   Counseling given: Yes   Outpatient Encounter Medications as of 01/04/2020  Medication Sig  . ibuprofen (ADVIL) 800 MG tablet Take 1 tablet (800 mg total) by mouth every 8 (eight) hours as needed for moderate pain. (Patient not taking: Reported on 01/04/2020)  . silver sulfADIAZINE (SILVADENE) 1 % cream Apply 1 application topically daily. (Patient not taking: Reported on 01/04/2020)  . [DISCONTINUED] fluticasone (FLONASE) 50 MCG/ACT nasal spray Place 2 sprays into both nostrils daily.   No facility-administered encounter medications on file as of 01/04/2020.     Review of Systems  Review of Systems  Constitutional: Negative.  Negative for fatigue and fever.  HENT: Negative.   Respiratory: Positive for cough. Negative for shortness of breath.   Cardiovascular: Negative.  Negative for chest pain, palpitations and leg swelling.   Gastrointestinal: Negative.   Allergic/Immunologic: Negative.   Neurological: Negative.   Psychiatric/Behavioral: Negative.        Physical Exam  BP 130/88 (BP Location: Left Arm)   Pulse 70   Temp (!) 96.8 F (36 C)   Ht 6\' 4"  (1.93 m)   Wt 233 lb 0.1 oz (105.7 kg)   SpO2 95%   BMI 28.36 kg/m   Wt Readings from Last 5 Encounters:  01/04/20 233 lb 0.1 oz (105.7 kg)  12/26/19 230 lb (104.3 kg)  12/08/17 227 lb (103 kg)     Physical Exam Vitals and nursing note reviewed.  Constitutional:      General: He is not in acute distress.    Appearance: He is well-developed and well-nourished.  Cardiovascular:     Rate and Rhythm: Normal rate and regular rhythm.  Pulmonary:     Effort: Pulmonary effort is normal.     Breath sounds: Normal breath sounds.  Musculoskeletal:     Right lower leg: No edema.     Left lower leg: No edema.  Skin:    General: Skin is warm and dry.  Neurological:     Mental Status: He is alert and oriented to person, place, and time.  Psychiatric:        Mood and Affect: Mood and affect and mood normal.        Behavior: Behavior normal.       Imaging: DG Chest Port 1 View  Result Date: 12/26/2019 CLINICAL DATA:  Cough, COVID-19 positive  EXAM: PORTABLE CHEST 1 VIEW COMPARISON:  Radiograph 03/12/2015 FINDINGS: Patchy opacities are present in the left mid lung and right suprahilar region. Airways thickening. No pneumothorax or effusion. The cardiomediastinal contours are unremarkable for portable technique. No acute osseous or soft tissue abnormality. Telemetry leads overlie the chest. IMPRESSION: Patchy opacities in the left mid lung and right suprahilar region, concerning for early pneumonia in the setting of COVID-19. Electronically Signed   By: Kreg Shropshire M.D.   On: 12/26/2019 15:12     Assessment & Plan:   Pneumonia due to COVID-19 virus Cough:   Stay well hydrated  Stay active  Deep breathing exercises  May start vitamin C  daily, vitamin D3 daily, Zinc daily  May take tylenol for fever or pain  May take mucinex twice daily  Will order chest x ray   Follow up:  Follow up in 3 weeks or sooner if needed      Ivonne Andrew, NP 01/05/2020

## 2020-01-05 DIAGNOSIS — J1282 Pneumonia due to coronavirus disease 2019: Secondary | ICD-10-CM | POA: Insufficient documentation

## 2020-01-05 NOTE — Assessment & Plan Note (Signed)
Cough:   Stay well hydrated  Stay active  Deep breathing exercises  May start vitamin C daily, vitamin D3 daily, Zinc daily  May take tylenol for fever or pain  May take mucinex twice daily  Will order chest x ray   Follow up:  Follow up in 3 weeks or sooner if needed

## 2020-01-25 ENCOUNTER — Ambulatory Visit: Payer: Commercial Managed Care - PPO

## 2020-01-26 ENCOUNTER — Ambulatory Visit: Payer: Commercial Managed Care - PPO

## 2021-04-17 IMAGING — DX DG CHEST 1V PORT
1 series · 1 of 1 positions shown · non-contrast
Comparison: Radiograph 03/12/2015

CLINICAL DATA: Cough, AM060-NU positive

EXAM:
PORTABLE CHEST 1 VIEW

[chest]
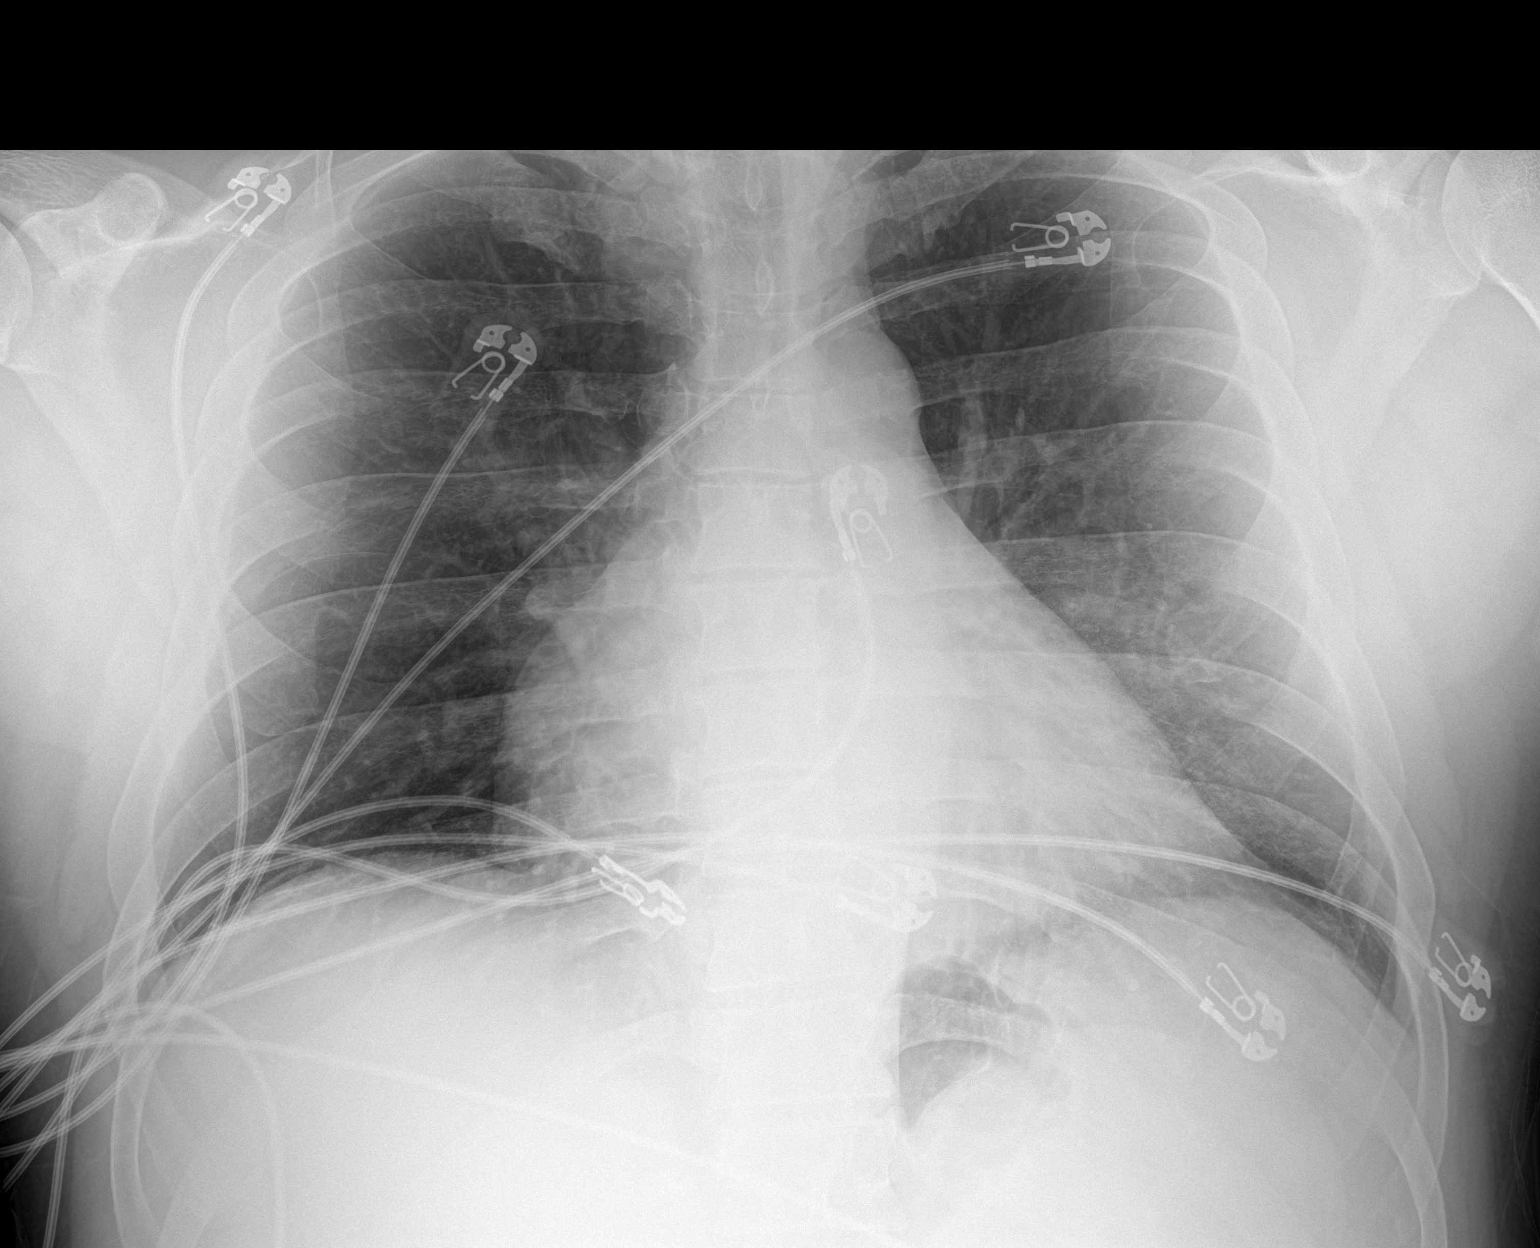

[1 of 1 positions shown; findings below may reference images not displayed]

FINDINGS: Patchy opacities are present in the left mid lung and right
suprahilar region. Airways thickening. No pneumothorax or effusion.
The cardiomediastinal contours are unremarkable for portable
technique. No acute osseous or soft tissue abnormality. Telemetry
leads overlie the chest.
IMPRESSION: Patchy opacities in the left mid lung and right suprahilar region,
concerning for early pneumonia in the setting of AM060-NU.
